# Patient Record
Sex: Male | Born: 1995
Health system: Southern US, Community
[De-identification: ages and names within clinical notes are randomized; demographics above are authoritative.]

---

## 2016-03-27 ENCOUNTER — Ambulatory Visit: Payer: BLUE CROSS/BLUE SHIELD | Attending: Otolaryngology

## 2016-08-23 ENCOUNTER — Emergency Department: Payer: BLUE CROSS/BLUE SHIELD

## 2016-08-23 ENCOUNTER — Emergency Department
Admission: EM | Admit: 2016-08-23 | Discharge: 2016-08-23 | Disposition: A | Discharge status: Home / Self Care | Payer: BLUE CROSS/BLUE SHIELD | Attending: Emergency Medicine | Admitting: Emergency Medicine

## 2016-08-23 ENCOUNTER — Encounter: Payer: Self-pay | Admitting: Radiology

## 2016-08-23 DIAGNOSIS — R1032 Left lower quadrant pain: Secondary | ICD-10-CM | POA: Diagnosis not present

## 2016-08-23 DIAGNOSIS — R1031 Right lower quadrant pain: Secondary | ICD-10-CM | POA: Diagnosis not present

## 2016-08-23 DIAGNOSIS — R109 Unspecified abdominal pain: Secondary | ICD-10-CM | POA: Diagnosis present

## 2016-08-23 LAB — URINALYSIS COMPLETE WITH MICROSCOPIC (ARMC ONLY)
BILIRUBIN URINE: NEGATIVE
Bacteria, UA: NONE SEEN
GLUCOSE, UA: NEGATIVE mg/dL
Hgb urine dipstick: NEGATIVE
KETONES UR: NEGATIVE mg/dL
Leukocytes, UA: NEGATIVE
Nitrite: NEGATIVE
PROTEIN: NEGATIVE mg/dL
SQUAMOUS EPITHELIAL / LPF: NONE SEEN
Specific Gravity, Urine: 1.017 (ref 1.005–1.030)
pH: 6 (ref 5.0–8.0)

## 2016-08-23 LAB — COMPREHENSIVE METABOLIC PANEL
ALK PHOS: 76 U/L (ref 38–126)
ALT: 41 U/L (ref 17–63)
AST: 35 U/L (ref 15–41)
Albumin: 4.5 g/dL (ref 3.5–5.0)
Anion gap: 7 (ref 5–15)
BILIRUBIN TOTAL: 0.5 mg/dL (ref 0.3–1.2)
BUN: 12 mg/dL (ref 6–20)
CALCIUM: 9.8 mg/dL (ref 8.9–10.3)
CO2: 28 mmol/L (ref 22–32)
CREATININE: 1.08 mg/dL (ref 0.61–1.24)
Chloride: 106 mmol/L (ref 101–111)
Glucose, Bld: 100 mg/dL — ABNORMAL HIGH (ref 65–99)
Potassium: 4 mmol/L (ref 3.5–5.1)
Sodium: 141 mmol/L (ref 135–145)
TOTAL PROTEIN: 8 g/dL (ref 6.5–8.1)

## 2016-08-23 LAB — CBC
HEMATOCRIT: 43 % (ref 40.0–52.0)
Hemoglobin: 15.2 g/dL (ref 13.0–18.0)
MCH: 30.2 pg (ref 26.0–34.0)
MCHC: 35.3 g/dL (ref 32.0–36.0)
MCV: 85.4 fL (ref 80.0–100.0)
Platelets: 282 10*3/uL (ref 150–440)
RBC: 5.04 MIL/uL (ref 4.40–5.90)
RDW: 13.4 % (ref 11.5–14.5)
WBC: 7.9 10*3/uL (ref 3.8–10.6)

## 2016-08-23 LAB — LIPASE, BLOOD: LIPASE: 41 U/L (ref 11–51)

## 2016-08-23 MED ORDER — ONDANSETRON HCL 4 MG/2ML IJ SOLN
4.0000 mg | Freq: Once | INTRAMUSCULAR | Status: AC
  Administered 2016-08-23: 4 mg via INTRAVENOUS

## 2016-08-23 MED ORDER — IOPAMIDOL (ISOVUE-300) INJECTION 61%
100.0000 mL | Freq: Once | INTRAVENOUS | Status: AC | PRN
  Administered 2016-08-23: 100 mL via INTRAVENOUS

## 2016-08-23 MED ORDER — IOPAMIDOL (ISOVUE-300) INJECTION 61%
30.0000 mL | Freq: Once | INTRAVENOUS | Status: AC | PRN
Start: 2016-08-23 — End: 2016-08-23
  Administered 2016-08-23: 30 mL via ORAL

## 2016-08-23 MED ORDER — ONDANSETRON HCL 4 MG/2ML IJ SOLN
INTRAMUSCULAR | Status: AC
  Administered 2016-08-23: 4 mg via INTRAVENOUS
  Filled 2016-08-23: qty 2

## 2016-08-23 NOTE — ED Notes (Signed)
Report to katie, rn 

## 2016-08-23 NOTE — ED Notes (Signed)

## 2016-08-23 NOTE — Discharge Instructions (Signed)
You were evaluated for abdominal pain, and although no certain cause was found, and your exam and evaluation are reassuring in the emergency department today.  Return to the emergency department for any worsening abdominal pain, black or bloody stool, fever, vomiting, vomiting blood, or any other symptoms concerning to you.

## 2016-08-23 NOTE — ED Triage Notes (Signed)
Pt in with co mid abd pain 4 days ago, saw at [facility] yesterday morning and had blood work done. Was told to take zantac and tums,also had some etoh intake. Pt has no hx of reflux no other history. Has had diarrhea for 3 days, no vomiting.

## 2016-08-23 NOTE — ED Notes (Signed)
Patient transported to CT 

## 2016-08-23 NOTE — ED Notes (Signed)
Pt states he has had generalized abd pain for 4 days with diarrhea. Pt states this am began to have "severe" abdominal pain that has localized to left and right lower quadrants. Pt states "something felt like it ruptured in my abdomen." pt states "it feel like there is loose fluid in there now or a mass". Pt states he has had nausea, denies fever or chills.

## 2016-08-23 NOTE — ED Provider Notes (Signed)
[facility] Emergency Department Provider Note ____________________________________________   I have reviewed the triage vital signs and the triage nursing note.  HISTORY  Chief Complaint Abdominal Pain   Historian Patient  HPI David Holden is a 20 y.o. male who came in today because of severe but intermittent/waxing and waning right-sided abdominal pains. He's been having some discomfort on the mid and right-sided abdomen for about 4 days now. He says that he saw this do not Center and had blood work drawn may be yesterday, but is not back yet. Overnight he developed severe right-sided stabbing abdominal pain. He's had some nausea without vomiting. He has some chronic loose stools for about a year now. No known diagnosis of inflammatory bowel disease or irritable bowel disease. Mom does have a history of gallbladder issues. Denies fever.  No chest pain or trouble breathing.  No dysuria or hematuria.  He states that for the first 3 years of college he had a fairly significant amount of anxiety for which she initially tried antidepressant, and then went off of that, previously tried Klonopin and like his symptoms are much better than they used to be, but questions whether anxiety could be giving him some symptoms.  Food does seem to bring on the pain.    History reviewed. No pertinent past medical history. Anxiety  There are no active problems to display for this patient.   No past surgical history on file.  Prior to Admission medications   Not on File    Allergies  Allergen Reactions  . Penicillins Hives    No family history on file.  Social History Social History  Substance Use Topics  . Smoking status: Not on file  . Smokeless tobacco: Not on file  . Alcohol use Not on file    Review of Systems  Constitutional: Negative for fever. Eyes: Negative for visual changes. ENT: Negative for sore throat. Cardiovascular: Negative for chest  pain. Respiratory: Negative for shortness of breath. Gastrointestinal: No black stools. Genitourinary: Negative for dysuria. Musculoskeletal: Negative for back pain. Skin: Negative for rash. Neurological: Negative for headache. 10 point Review of Systems otherwise negative ____________________________________________   PHYSICAL EXAM:  VITAL SIGNS: ED Triage Vitals  Enc Vitals Group     BP 08/23/16 0232 (!) 169/105     Pulse Rate 08/23/16 0232 89     Resp 08/23/16 0232 18     Temp 08/23/16 0232 98.6 F (37 C)     Temp Source 08/23/16 0232 Oral     SpO2 08/23/16 0232 96 %     Weight 08/23/16 0233 190 lb (86.2 kg)     Height 08/23/16 0233 5\' 10"  (1.778 m)     Head Circumference --      Peak Flow --      Pain Score 08/23/16 0233 4     Pain Loc --      Pain Edu? --      Excl. in GC? --      Constitutional: Alert and oriented. Well appearing and in no distress. HEENT   Head: Normocephalic and atraumatic.      Eyes: Conjunctivae are normal. PERRL. Normal extraocular movements.      Ears:         Nose: No congestion/rhinnorhea.   Mouth/Throat: Mucous membranes are moist.   Neck: No stridor. Cardiovascular/Chest: Normal rate, regular rhythm.  No murmurs, rubs, or gallops. Respiratory: Normal respiratory effort without tachypnea nor retractions. Breath sounds are clear and equal bilaterally.  No wheezes/rales/rhonchi. Gastrointestinal: Soft. No distention, no guarding, no rebound. Moderate tenderness in the right side of the abdomen without focal right upper quadrant or focal McBurney's point tenderness, but diffuse tenderness across the right side of the abdomen. No organomegaly.  Genitourinary/rectal:Deferred Musculoskeletal: Nontender with normal range of motion in all extremities. No joint effusions.  No lower extremity tenderness.  No edema. Neurologic:  Normal speech and language. No gross or focal neurologic deficits are appreciated. Skin:  Skin is warm, dry and  intact. No rash noted. Psychiatric: Mood and affect are normal. Speech and behavior are normal. Patient exhibits appropriate insight and judgment.   ____________________________________________  LABS (pertinent positives/negatives)  Labs Reviewed  COMPREHENSIVE METABOLIC PANEL - Abnormal; Notable for the following:       Result Value   Glucose, Bld 100 (*)    All other components within normal limits  URINALYSIS COMPLETEWITH MICROSCOPIC ([facility] ONLY) - Abnormal; Notable for the following:    Color, Urine YELLOW (*)    APPearance CLEAR (*)    All other components within normal limits  CBC  LIPASE, BLOOD    ____________________________________________    EKG I, Governor Rooks, MD, the attending physician have personally viewed and interpreted all ECGs.  None ____________________________________________  RADIOLOGY All Xrays were viewed by me. Imaging interpreted by Radiologist.  CT abdomen and pelvis with contrast:  IMPRESSION: There is no evidence of diverticulitis. The appendix is normal. The patient does have a fairly large amount of fecal matter in the colon, which could possibly relate to the symptoms. No true pathologic finding by CT.  Ultrasound right upper quadrant: IMPRESSION: Normal examination. No cause of pain identified. __________________________________________  PROCEDURES  Procedure(s) performed: None  Critical Care performed: None  ____________________________________________   ED COURSE / ASSESSMENT AND PLAN  Pertinent labs & imaging results that were available during my care of the patient were reviewed by me and considered in my medical decision making (see chart for details).  [name] is here for 4 days of intermittent but waxing and waning abdominal pain, was worse when he came in, and now about 1 out of 10. Laboratory studies are reassuring, with no elevated white blood cell count, renal failure, Y disturbance, or acute anemia. Urinalysis is  reassuring.  Based on symptoms, unclear to me for most likely diagnosis, however given the right-sided discomfort, I discussed with him next step imaging. After discussion of risk and benefit, we did chose to [place] with CT imaging.  CT scan was negative for acute findings, and certainly negative for appendicitis. We did discuss the finding of moderate stool burning, and patient states that he had a bowel movement about a half hour ago and there were too hard pieces comment towards possible may be constipation is concerning.  Father showed up and we discussed family history of biliary disease. I will send an ultrasound to look for gallstones.  Mr. Rosato and I also discussed the contribution of anxiety to both gastritis as well as irritable bowel symptoms. Ultimately I think I will recommend follow-up with a gastroenterologist, but I have asked him to also consider making sure his anxiety is under control.  Ultrasound is reassuring. I am going to discharge him home for follow-up with primary care, either at the [facility] or at [facility]. I have given the office number for [facility] radiology, I do think this may be the next step.    CONSULTATIONS:   None   Patient / Family / Caregiver informed of  clinical course, medical decision-making process, and agree with plan.   I discussed return precautions, follow-up instructions, and discharge instructions with patient and/or family.   ___________________________________________   FINAL CLINICAL IMPRESSION(S) / ED DIAGNOSES   Final diagnoses:  Right sided abdominal pain              Note: This dictation was prepared with Dragon dictation. Any transcriptional errors that result from this process are unintentional    Governor Rooksebecca Marlynn Hinckley, MD 08/23/16 1132

## 2016-08-23 NOTE — ED Notes (Signed)
Monitor in another room alarming. Pt is yelling loudly "hello, someone, help". Pt encouraged to relax, pt also encouraged to ring call bell that is next to right hand if he needs something. Informed pt that it is not his alarm ringing but another pt that rn is in room with. Pt verbalizes understanding. Pt resumes texting on phone after this encounter.

## 2017-04-23 IMAGING — CT CT ABD-PELV W/ CM
2 of 4 series · 16 of 46 positions shown, 18 images · IV contrast (APPLIED)
Comparison: None.

CLINICAL DATA: Right lower quadrant pain beginning 2 weeks ago
worsening last night.

EXAM:
CT ABDOMEN AND PELVIS WITH CONTRAST
TECHNIQUE: Multidetector CT imaging of the abdomen and pelvis was performed
using the standard protocol following bolus administration of
intravenous contrast.
CONTRAST:  100mL [id number]-H55 IOPAMIDOL (DI3FDJ-H55) INJECTION 61%

[Series 2: axial st · axial · 0.77mm/px · z∈[-527,-72]mm · 13 of 99 slices shown, 15 images]
[im 4/99  soft-tissue]
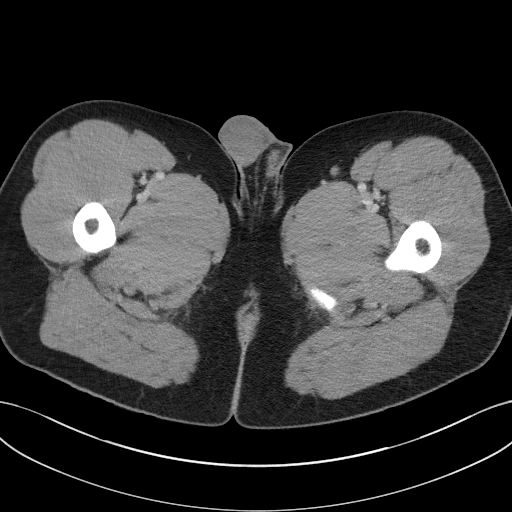
[im 4/99  bone]
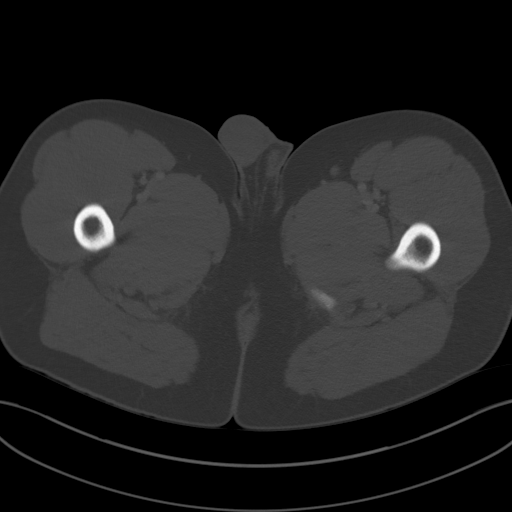
[im 12/99  soft-tissue]
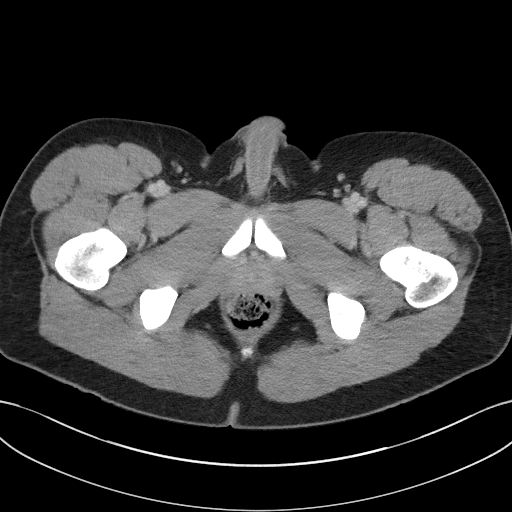
[im 20/99  soft-tissue]
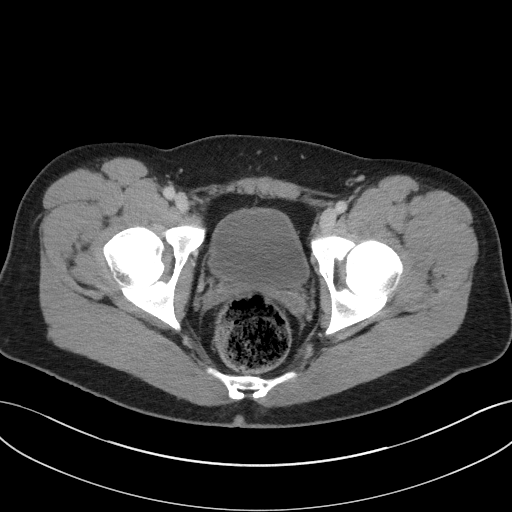
[im 28/99  soft-tissue]
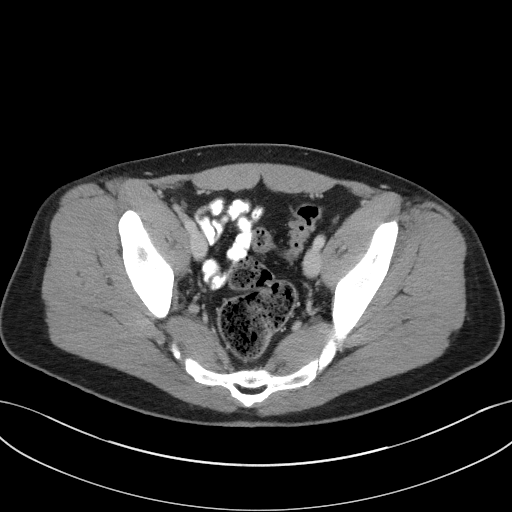
[im 36/99  soft-tissue]
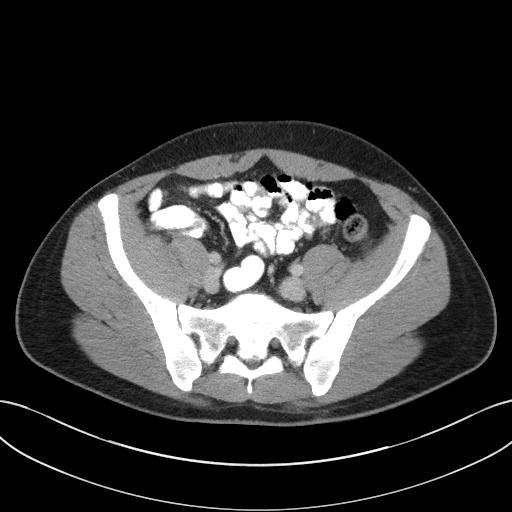
[im 44/99  soft-tissue]
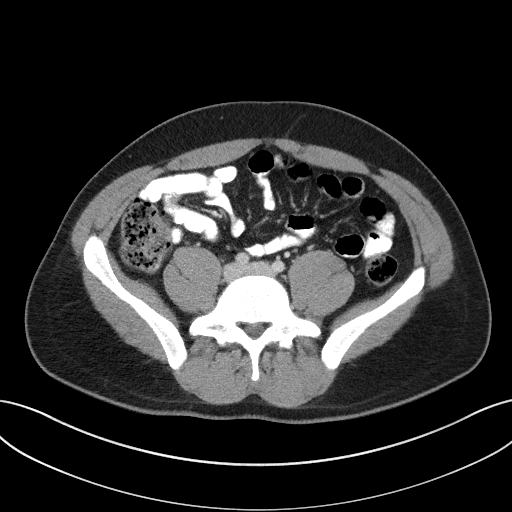
[im 51/99  soft-tissue]
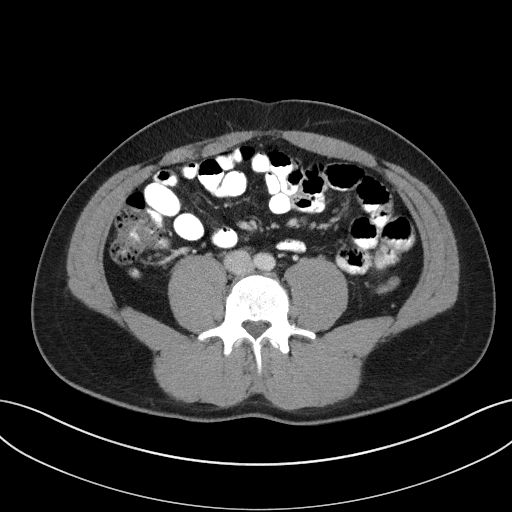
[im 55/99  soft-tissue]
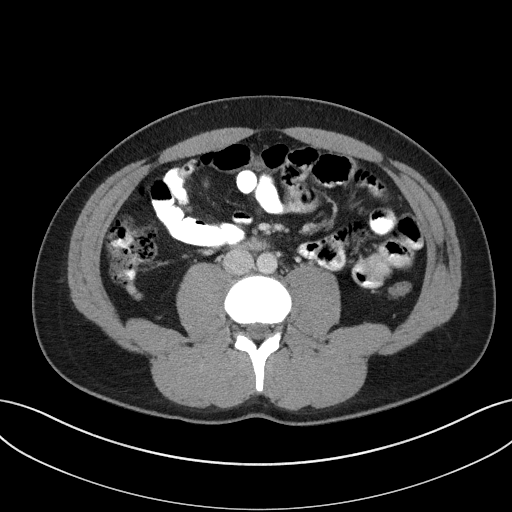
[im 63/99  soft-tissue]
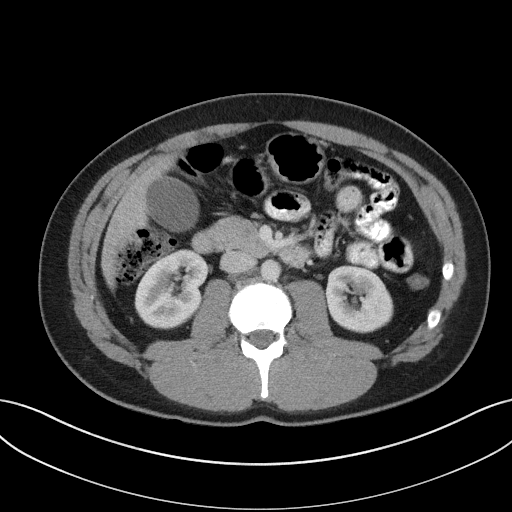
[im 63/99  bone]
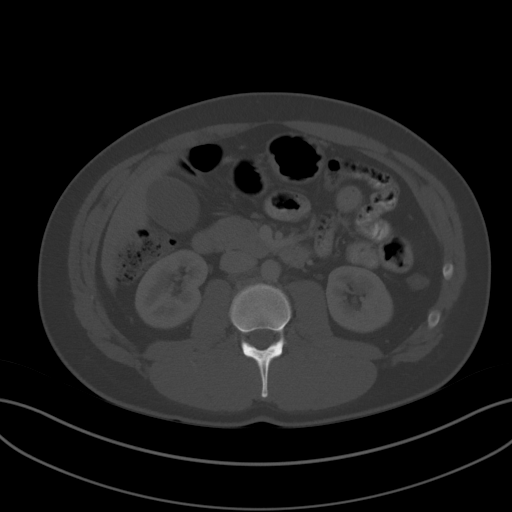
[im 71/99  soft-tissue]
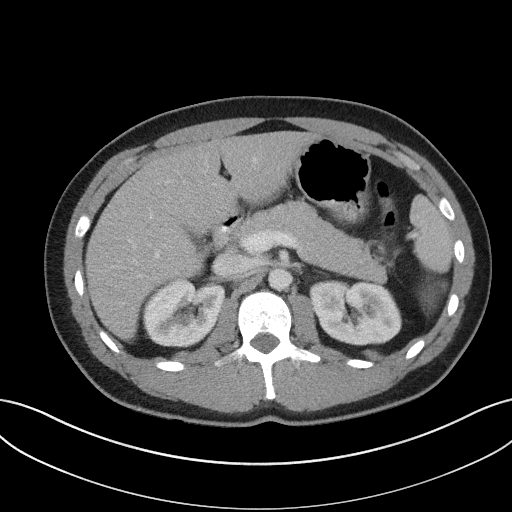
[im 79/99  soft-tissue]
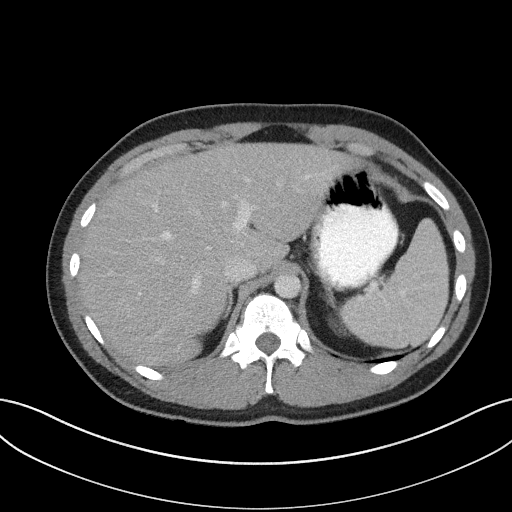
[im 87/99  soft-tissue]
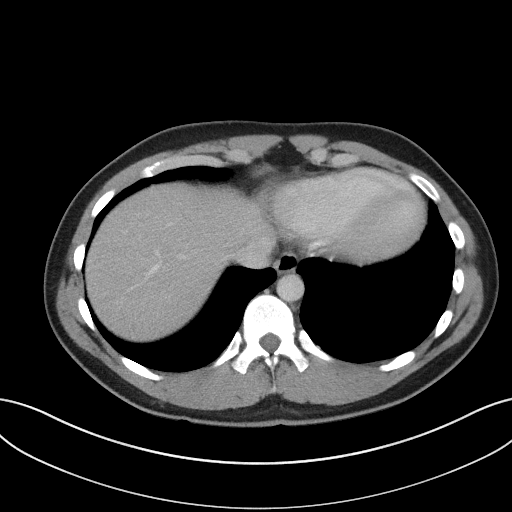
[im 95/99  soft-tissue]
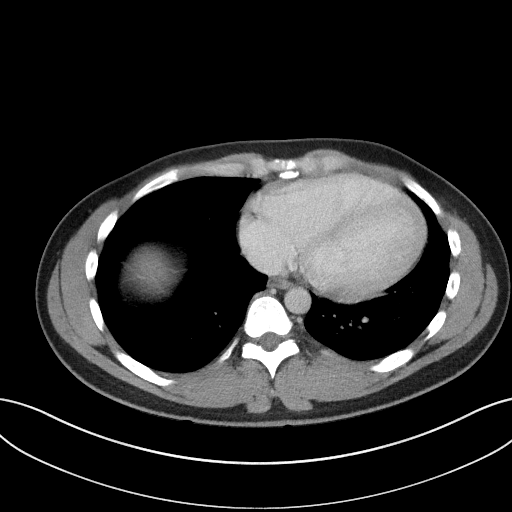

[Series 5: coronal st · coronal · 0.71mm/px · 3 of 82 slices shown]
[im 28/82  soft-tissue]
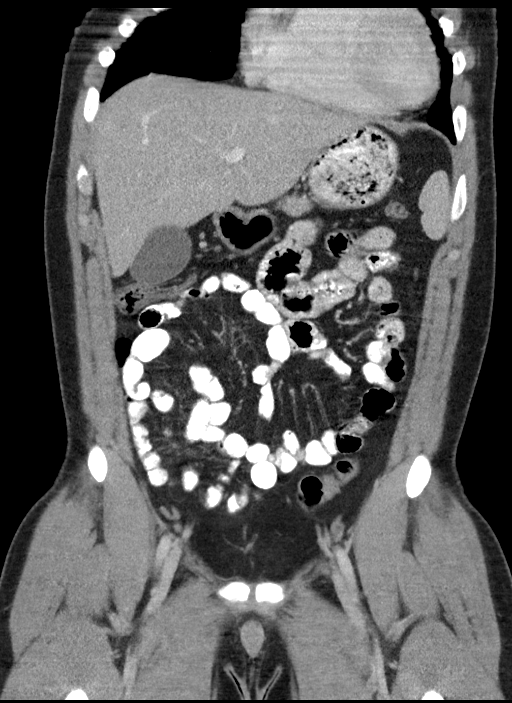
[im 37/82  soft-tissue]
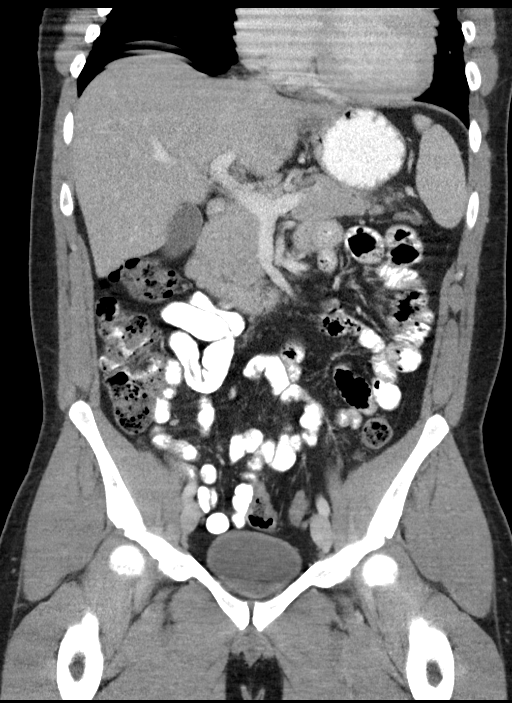
[im 46/82  soft-tissue]
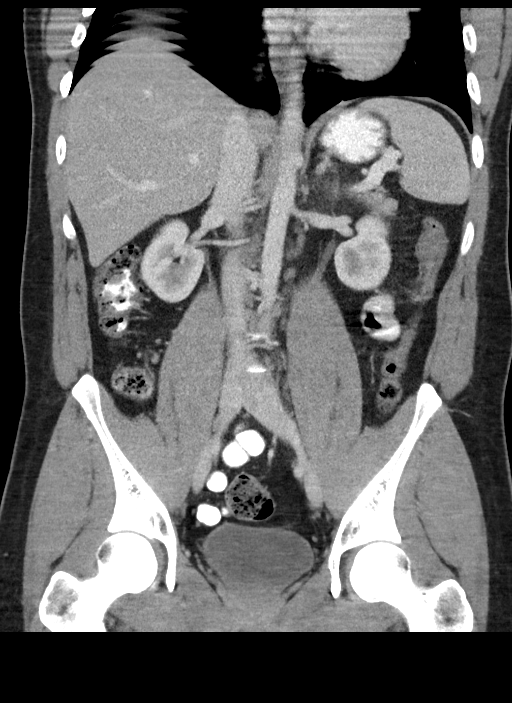

[16 of 46 positions shown; findings below may reference images not displayed]

FINDINGS: Lower chest: Lung bases are clear.  No pleural or pericardial fluid.

Hepatobiliary: Normal

Pancreas: Normal

Spleen: Normal

Adrenals/Urinary Tract: Adrenal glands are normal. Kidneys are
normal.

Stomach/Bowel: Normal. Normal appendix. Moderate amount of fecal
matter in the colon.

Vascular/Lymphatic: Normal

Reproductive: Normal

Other: None

Musculoskeletal: Normal
IMPRESSION: There is no evidence of diverticulitis. The appendix is normal. The
patient does have a fairly large amount of fecal matter in the
colon, which could possibly relate to the symptoms. No true
pathologic finding by CT.
